# Patient Record
Sex: Male | Born: 2000 | Hispanic: Yes | Marital: Single | Smoking: Never smoker
Health system: Southern US, Community
[De-identification: ages and names within clinical notes are randomized; demographics above are authoritative.]

---
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
## 2023-01-24 ENCOUNTER — Ambulatory Visit (INDEPENDENT_AMBULATORY_CARE_PROVIDER_SITE_OTHER): Payer: Medicaid Other | Admitting: Licensed Clinical Social Worker

## 2023-01-24 DIAGNOSIS — F331 Major depressive disorder, recurrent, moderate: Secondary | ICD-10-CM | POA: Insufficient documentation
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
